# Patient Record
Sex: Female | Born: 1985 | Race: White | Hispanic: No | Marital: Married | Smoking: Never smoker
Health system: Southern US, Community
[De-identification: ages and names within clinical notes are randomized; demographics above are authoritative.]

## PROBLEM LIST (undated history)

## (undated) DIAGNOSIS — Z803 Family history of malignant neoplasm of breast: Secondary | ICD-10-CM

## (undated) DIAGNOSIS — Z8042 Family history of malignant neoplasm of prostate: Secondary | ICD-10-CM

## (undated) DIAGNOSIS — Z800 Family history of malignant neoplasm of digestive organs: Secondary | ICD-10-CM

## (undated) HISTORY — DX: Family history of malignant neoplasm of breast: Z80.3

## (undated) HISTORY — DX: Family history of malignant neoplasm of prostate: Z80.42

## (undated) HISTORY — DX: Family history of malignant neoplasm of digestive organs: Z80.0

---

## 2013-10-21 ENCOUNTER — Emergency Department (HOSPITAL_COMMUNITY)
Admission: EM | Admit: 2013-10-21 | Discharge: 2013-10-21 | Disposition: A | Discharge status: Home / Self Care | Payer: PRIVATE HEALTH INSURANCE | Attending: Emergency Medicine | Admitting: Emergency Medicine

## 2013-10-21 ENCOUNTER — Emergency Department (HOSPITAL_COMMUNITY): Payer: PRIVATE HEALTH INSURANCE

## 2013-10-21 ENCOUNTER — Encounter (HOSPITAL_COMMUNITY): Payer: Self-pay | Admitting: Emergency Medicine

## 2013-10-21 DIAGNOSIS — Y9389 Activity, other specified: Secondary | ICD-10-CM | POA: Insufficient documentation

## 2013-10-21 DIAGNOSIS — W268XXA Contact with other sharp object(s), not elsewhere classified, initial encounter: Secondary | ICD-10-CM | POA: Insufficient documentation

## 2013-10-21 DIAGNOSIS — S61409A Unspecified open wound of unspecified hand, initial encounter: Secondary | ICD-10-CM | POA: Insufficient documentation

## 2013-10-21 DIAGNOSIS — Y929 Unspecified place or not applicable: Secondary | ICD-10-CM | POA: Insufficient documentation

## 2013-10-21 DIAGNOSIS — IMO0002 Reserved for concepts with insufficient information to code with codable children: Secondary | ICD-10-CM

## 2013-10-21 MED ORDER — TETANUS-DIPHTH-ACELL PERTUSSIS 5-2.5-18.5 LF-MCG/0.5 IM SUSP
0.5000 mL | Freq: Once | INTRAMUSCULAR | Status: AC
  Administered 2013-10-21: 0.5 mL via INTRAMUSCULAR
  Filled 2013-10-21: qty 0.5

## 2013-10-21 NOTE — ED Provider Notes (Signed)
Medical screening examination/treatment/procedure(s) were performed by non-physician practitioner and as supervising physician I was immediately available for consultation/collaboration.  EKG Interpretation   None         Avigdor Dollar Y. Terrian Ridlon, MD 10/21/13 2329 
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
## 2014-03-31 ENCOUNTER — Emergency Department (HOSPITAL_COMMUNITY): Payer: No Typology Code available for payment source

## 2014-03-31 ENCOUNTER — Encounter (HOSPITAL_COMMUNITY): Payer: Self-pay | Admitting: Emergency Medicine

## 2014-03-31 ENCOUNTER — Emergency Department (HOSPITAL_COMMUNITY)
Admission: EM | Admit: 2014-03-31 | Discharge: 2014-03-31 | Disposition: A | Discharge status: Home / Self Care | Payer: No Typology Code available for payment source | Attending: Emergency Medicine | Admitting: Emergency Medicine

## 2014-03-31 DIAGNOSIS — S0990XA Unspecified injury of head, initial encounter: Secondary | ICD-10-CM | POA: Insufficient documentation

## 2014-03-31 DIAGNOSIS — M25562 Pain in left knee: Secondary | ICD-10-CM

## 2014-03-31 DIAGNOSIS — Z79899 Other long term (current) drug therapy: Secondary | ICD-10-CM | POA: Diagnosis not present

## 2014-03-31 DIAGNOSIS — S8990XA Unspecified injury of unspecified lower leg, initial encounter: Secondary | ICD-10-CM | POA: Diagnosis present

## 2014-03-31 DIAGNOSIS — Y9389 Activity, other specified: Secondary | ICD-10-CM | POA: Insufficient documentation

## 2014-03-31 DIAGNOSIS — Y92410 Unspecified street and highway as the place of occurrence of the external cause: Secondary | ICD-10-CM | POA: Diagnosis not present

## 2014-03-31 DIAGNOSIS — S99919A Unspecified injury of unspecified ankle, initial encounter: Secondary | ICD-10-CM | POA: Diagnosis not present

## 2014-03-31 DIAGNOSIS — S99929A Unspecified injury of unspecified foot, initial encounter: Principal | ICD-10-CM

## 2014-03-31 MED ORDER — IBUPROFEN 800 MG PO TABS: 800.0000 mg | ORAL_TABLET | Freq: Three times a day (TID) | ORAL | Status: AC

## 2014-03-31 MED ORDER — ACETAMINOPHEN 325 MG PO TABS
650.0000 mg | ORAL_TABLET | Freq: Once | ORAL | Status: AC
  Administered 2014-03-31: 650 mg via ORAL
  Filled 2014-03-31: qty 2

## 2014-03-31 MED ORDER — ACETAMINOPHEN 500 MG PO TABS: 500.0000 mg | ORAL_TABLET | Freq: Four times a day (QID) | ORAL | Status: AC | PRN
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
## 2014-03-31 NOTE — ED Notes (Signed)
Pt is not in adult waiting room at this time.
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
## 2014-03-31 NOTE — ED Notes (Signed)
Attempted to call x2
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
## 2014-04-01 NOTE — ED Provider Notes (Signed)
Medical screening examination/treatment/procedure(s) were performed by non-physician practitioner and as supervising physician I was immediately available for consultation/collaboration.   EKG Interpretation None       Arley Pheniximothy M Rondalyn Belford, MD 04/01/14 [id number]
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
## 2016-03-04 ENCOUNTER — Encounter: Payer: Self-pay | Admitting: Genetic Counselor

## 2016-04-03 ENCOUNTER — Encounter: Payer: Self-pay | Admitting: Genetic Counselor

## 2016-04-09 ENCOUNTER — Encounter: Payer: Self-pay | Admitting: Genetic Counselor

## 2016-04-09 ENCOUNTER — Ambulatory Visit (HOSPITAL_BASED_OUTPATIENT_CLINIC_OR_DEPARTMENT_OTHER): Payer: No Typology Code available for payment source | Admitting: Genetic Counselor

## 2016-04-09 ENCOUNTER — Other Ambulatory Visit: Payer: No Typology Code available for payment source

## 2016-04-09 DIAGNOSIS — Z800 Family history of malignant neoplasm of digestive organs: Secondary | ICD-10-CM | POA: Diagnosis not present

## 2016-04-09 DIAGNOSIS — Z803 Family history of malignant neoplasm of breast: Secondary | ICD-10-CM | POA: Diagnosis not present

## 2016-04-09 DIAGNOSIS — Z315 Encounter for genetic counseling: Secondary | ICD-10-CM | POA: Diagnosis not present

## 2016-04-09 DIAGNOSIS — Z8042 Family history of malignant neoplasm of prostate: Secondary | ICD-10-CM | POA: Insufficient documentation

## 2016-04-09 NOTE — Progress Notes (Signed)
REFERRING PROVIDER: Betsy Coder, MD  PRIMARY PROVIDER:  No PCP Per Patient  PRIMARY REASON FOR VISIT:  1. Family history of colon cancer   2. Family history of breast cancer   3. Family history of prostate cancer      HISTORY OF PRESENT ILLNESS:   Holly Vance, a 30 y.o. female, was seen for a [place] cancer genetics consultation at the request of Dr. Benay Spice due to a family history of cancer.  Holly Vance presents to clinic today to discuss the possibility of a hereditary predisposition to cancer, genetic testing, and to further clarify her future cancer risks, as well as potential cancer risks for family members. Holly Vance is a 30 y.o. female with no personal history of cancer.  Her mother, Dustin Flock, recently passed away from colon cancer.    CANCER HISTORY:   No history exists.     HORMONAL RISK FACTORS:  Menarche was at age 21.  First live birth at age 89.  OCP use for approximately 14 years.  Ovaries intact: yes.  Hysterectomy: no.  Menopausal status: premenopausal.  HRT use: 0 years. Colonoscopy: yes; normal. Mammogram within the last year: no. Number of breast biopsies: 0. Up to date with pelvic exams:  yes. Any excessive radiation exposure in the past:  no  Past Medical History  Diagnosis Date  . Family history of breast cancer   . Family history of prostate cancer   . Family history of colon cancer     No past surgical history on file.  Social History   Social History  . Marital Status: Married    Spouse Name: N/A  . Number of Children: N/A  . Years of Education: N/A   Social History Main Topics  . Smoking status: Never Smoker   . Smokeless tobacco: Not on file  . Alcohol Use: No  . Drug Use: No  . Sexual Activity: Not on file   Other Topics Concern  . Not on file   Social History Narrative     FAMILY HISTORY:  We obtained a detailed, 4-generation family history.  Significant diagnoses are listed below: Family History   Problem Relation Age of Onset  . Colon cancer Mother 37  . Breast cancer Maternal Aunt 60  . Colon cancer Other 4    maternal great uncle  . Breast cancer Other 5    maternal great aunt  . Prostate cancer Cousin 41    maternal first cousin    The patient has 85 year old twin daughters who are healthy.  She has twin sisters and one brother who are all cancer free.  Her mother passed away at age 16 from colon cancer.  Her father is alive at 63.  Her mothe had three sisters and two brothers.  One sister had breast cancer at age 49 and a brother has a son with prostate cancer at 50.  The patient's maternal grandparents are alive in their 73s.  her grandmother had one sister with breast cancer at 69 and a brother with colon cancer at 15.  The patient's father has three brothers and two sisters.  One brother had colon cancer in his mid 54s.  Her paternal grandfather had leukemia at 78 and died. Patient's maternal ancestors are of [place] descent, and paternal ancestors are of [place], [place] and [place] descent. There is reported Ashkenazi Jewish ancestry - specifically her maternal great grandmother was an [place] Jew. There is no known consanguinity.  GENETIC COUNSELING ASSESSMENT: Holly Vance  Holly Vance is a 30 y.o. female with a family history of breast, prostate and colon cancer with Ashkenazi Jewish ancestry which is somewhat suggestive of a hereditary cancer syndrome and predisposition to cancer. We, therefore, discussed and recommended the following at today's visit.   DISCUSSION: We discussed that about 5% of colon cancer is hereditary, with most cases due to MMR mutations that cause Lynch syndrome.  We discussed that in some families we can also see both breast and prostate cancer.  Another gene that increases the risk for both breast and colon cancer is CHEK2.  Based on Holly Vance's Ashkenazi Jewish ancestry and family history of both breast and colon cancer we should also consider  testing for BRCA mutations and the APC [id number] mutation.  We reviewed the characteristics, features and inheritance patterns of hereditary cancer syndromes. We also discussed genetic testing, including the appropriate family members to test, the process of testing, insurance coverage and turn-around-time for results. We discussed the implications of a negative, positive and/or variant of uncertain significant result. We recommended Holly Vance pursue genetic testing for the Comprehensive cancer gene panel. The Comprehensive Cancer Panel offered by [facility] includes sequencing and/or deletion duplication testing of the following 32 genes: APC, ATM, AXIN2, BARD1, BMPR1A, BRCA1, BRCA2, BRIP1, CDH1, CDK4, CDKN2A, CHEK2, EPCAM, FANCC, MLH1, MSH2, MSH6, MUTYH, NBN, PALB2, PMS2, POLD1, POLE, PTEN, RAD51C, RAD51D, SCG5/GREM1, SMAD4, STK11, TP53, VHL, and XRCC2.     Based on Holly Vance's family history of cancer, she meets medical criteria for genetic testing. Despite that she meets criteria, she may still have an out of pocket cost. We discussed that if her out of pocket cost for testing is over $100, the laboratory will call and confirm whether she wants to proceed with testing.  If the out of pocket cost of testing is less than $100 she will be billed by the genetic testing laboratory.   PLAN: After considering the risks, benefits, and limitations, Holly Vance  provided informed consent to pursue genetic testing and the blood sample was sent to [facility] for analysis of the Comprehensive Cancer panel. Results should be available within approximately 2-3 weeks' time, at which point they will be disclosed by telephone to Holly Vance, as will any additional recommendations warranted by these results. Holly Vance will receive a summary of her genetic counseling visit and a copy of her results once available. This information will also be available in Epic. We encouraged Holly Vance to remain in  contact with cancer genetics annually so that we can continuously update the family history and inform her of any changes in cancer genetics and testing that may be of benefit for her family. Holly Vance's questions were answered to her satisfaction today. Our contact information was provided should additional questions or concerns arise.  Lastly, we encouraged Holly Vance to remain in contact with cancer genetics annually so that we can continuously update the family history and inform her of any changes in cancer genetics and testing that may be of benefit for this family.   Ms.  Vance's questions were answered to her satisfaction today. Our contact information was provided should additional questions or concerns arise. Thank you for the referral and allowing [place] to share in the care of your patient.   Karen P. Florene Glen, [place], [facility] Certified Genetic Counselor [email or web address] phone: [phone]  The patient was seen for a total of 45 minutes in face-to-face genetic counseling.  This patient was discussed with Drs. Magrinat, Lindi Adie and/or Burr Medico who  agrees with the above.    _______________________________________________________________________ For Office Staff:  Number of people involved in session: 1 Was an Intern/ student involved with case: no

## 2016-05-06 ENCOUNTER — Telehealth: Payer: Self-pay | Admitting: Genetic Counselor

## 2016-05-06 NOTE — Telephone Encounter (Signed)
LM to please call me back.  Patient's results are pending, awaiting her approval of the out of pocket cost.  I want to talk with her about whether she wants to pursue testing and if so, how to get the costs down.
